# Patient Record
Sex: Female | Born: 1990 | Race: Black or African American | Hispanic: No | Marital: Single | Smoking: Never smoker
Health system: Southern US, Community
[De-identification: ages and names within clinical notes are randomized; demographics above are authoritative.]

---

## 2013-07-27 NOTE — Progress Notes (Signed)
Chief Complaint   Patient presents with   ??? New Patient   ??? Loss of Consciousness     fainting spells since 8th grade, recently 06/2013 patient fainted while getting something to eat at [facility]     Patient here to establish as a new patient  Patient c/o since 8th grade she has had fainting spells, recently in month of July 2014 while at [facility] getting something to eat the patient fainted and sat immediately down and was taken to a couch to lie down and she taken by Ambulance to the Emergency Room at the [facility] for further evaluation. Patient was diagnosed with syncope, and the patient was given fluids, labs were taken and her glucose was low and was given something to eat and she felt better. Orthostatic Blood Pressure was taken, EKG and was told the EKG was normal.

## 2013-07-27 NOTE — Progress Notes (Signed)
HISTORY OF PRESENT ILLNESS  Tracy [name] is a 22 y.o. female.  Chief Complaint   Patient presents with   ??? New Patient   ??? Loss of Consciousness     fainting spells since 8th grade, recently 06/2013 patient fainted while getting something to eat at [facility]       HPI  Patient here to establish as a new patient.     Patient reports that since 8th grade she has had fainting spells.  The most recent occurred last month.  Pt was at a summer program at [employer]; she was in the cafeteria getting ready to eat when she had to put her head down.  She felt like things were fading to black.  She did not have any chest pain or palpitations.  It had been approximately 12 hours since her last meal.  She thinks she did pass out.  Friends insisted that she go to the Emergency Room at the [facility] for further evaluation.  Patient was diagnosed with syncope.  She was given IV fluids and labs were drawn.  She was told that her glucose was low.  She felt better after eating.   She did have an EKG and was told the EKG was normal.     Pt does not think that these episodes have been preceded by chest pain or palpitations in the past.  She is usually aware that she is about to pass out.  She does not have any convulsions.  She does not have a post-ictal state.  She does not feel that she goes too long without eating but does confess to having skipped meals at times.    Review of Systems   Constitutional: Negative.    HENT: Negative.    Respiratory: Negative.    Cardiovascular: Negative.  Negative for chest pain and palpitations.   Neurological: Positive for loss of consciousness.   All other systems reviewed and are negative.        Physical Exam  Physical Exam   Nursing note and vitals reviewed.  Constitutional: She is oriented to person, place, and time. She appears well-developed and well-nourished.   HENT:   Head: Normocephalic and atraumatic.   Right Ear: External ear normal.   Left Ear: External ear normal.    Nose: Nose normal.   Eyes: Conjunctivae and EOM are normal.   Neck: Normal range of motion. Neck supple. No JVD present. Carotid bruit is not present. No thyromegaly present.   Cardiovascular: Normal rate, regular rhythm, normal heart sounds and intact distal pulses.  Exam reveals no gallop and no friction rub.    No murmur heard.  Pulmonary/Chest: Effort normal and breath sounds normal. She has no wheezes. She has no rhonchi. She has no rales.   Abdominal: Soft. Bowel sounds are normal.   Musculoskeletal: Normal range of motion.   Neurological: She is alert and oriented to person, place, and time. Coordination normal.   Skin: Skin is warm and dry.   Psychiatric: She has a normal mood and affect. Her behavior is normal. Judgment and thought content normal.     Ekg: nsr  ASSESSMENT and PLAN  Tracy [name] was seen today for new patient and loss of consciousness.    Diagnoses and associated orders for this visit:    Syncopal episodes  - REFERRAL TO CARDIOLOGY to r/o underlying cardiac disorder  - CBC WITH AUTOMATED DIFF  - METABOLIC PANEL, COMPREHENSIVE  - TSH, 3RD GENERATION  -  GLUCOSE TOLERANCE (4 SP BLOOD)  - AMB POC EKG ROUTINE W/ 12 LEADS, INTER & REP      Over 30 min spent with pt; greater than 50% of this time spent in counseling.  Discussed treatment plan at length; all questions answered.      Follow-up Disposition: as indicated; discussed possible referral to endo if cards eval negative

## 2013-07-27 NOTE — Patient Instructions (Signed)
Please contact our office if you have any questions about your visit today.

## 2013-07-27 NOTE — Telephone Encounter (Signed)
Chief Complaint   Patient presents with   ??? Referral Request     Cardiology Dr Victorio Palm Chough appt.07/27/13 at 11:20 am check in 11;00 am     07/27/13 at 3:30 pm: per Dr. Luiz Blare patient reached and informed her Cardiology appointment with Dr. Garwin Brothers at the [facility] facility [phone] /fax [phone] is scheduled for 07/30/13 at 11:20 am check in 11:00 am, and states she understands all.

## 2013-07-30 NOTE — Progress Notes (Signed)
HISTORY OF PRESENT ILLNESS  Tracy [name] is a 22 y.o. female.    HPI  She is referred to my office for evaluation of recurrent presyncope and syncope. She has had a long standing history of dizziness and syncope since 8th grade.  She is in college and she has had recurrence of severe dizziness and fainting spell.  Last episode was several weeks ago while standing in line to get her lunch.  She felt extremely hot and clammy and subsequently she sat down in a chair and her dizziness gradually dissipated. She was nauseated.  She indicates that frequently she gets nauseated and clammy before she passes out.  She has never had any chest pain or palpitations prior to dizziness or fainting spell.  She does exercise but she has never had any difficulties with dizziness or fainting spell with exercise.  One time, she was told that her blood sugar was low.  She is a non smoker and non drinker.  She has no history of hypertension or diabetes mellitus.  There is no family history of sudden death.  She has no history of congenital heart disease during childhood.        Review of Systems   Constitutional: Negative for weight loss and malaise/fatigue.   HENT: Negative for hearing loss.    Eyes: Negative for blurred vision and double vision.   Respiratory: Negative for shortness of breath.    Cardiovascular: Negative for chest pain, palpitations, orthopnea, claudication, leg swelling and PND.   Gastrointestinal: Negative for heartburn and blood in stool.   Genitourinary: Negative for hematuria.   Musculoskeletal: Negative for back pain and joint pain.   Skin: Negative for itching and rash.   Neurological: Positive for dizziness and loss of consciousness. Negative for weakness and headaches.       Physical Exam   Constitutional: She is oriented to person, place, and time. She appears well-developed and well-nourished.   HENT:   Head: Normocephalic.   Eyes: Conjunctivae are normal. Pupils are equal, round, and reactive to light.    Neck: Normal range of motion. Neck supple. No JVD present.   Cardiovascular: Normal rate, regular rhythm, S1 normal and S2 normal.   No extrasystoles are present. PMI is not displaced.  Exam reveals no gallop and no friction rub.    No murmur heard.  Pulses:       Carotid pulses are 3+ on the right side, and 3+ on the left side.  Pulmonary/Chest: Effort normal. She has no rales.   Abdominal: Soft. There is no tenderness.   Musculoskeletal: She exhibits no edema.   Neurological: She is alert and oriented to person, place, and time.   Skin: Skin is warm and dry.     BP 92/68   Pulse 63   Ht 5\' 2"  (1.575 m)   Wt 53.978 kg (119 lb)   BMI 21.76 kg/m2   SpO2 97%   LMP 07/12/2013    Past Medical History   Diagnosis Date   ??? Low blood sugar 06/2013     episode at [facility] while trying to get something to eat fainted   ??? Headache 07/26/13     severe headaches at times   ??? Heart murmur of newborn 09-18-1991     at birth       History     Social History   ??? Marital Status: SINGLE     Spouse Name: N/A     Number of Children: N/A   ???  Years of Education: N/A     Occupational History   ??? Not on file.     Social History Main Topics   ??? Smoking status: Never Smoker    ??? Smokeless tobacco: Never Used   ??? Alcohol Use: No   ??? Drug Use: No   ??? Sexually Active: Not on file     Other Topics Concern   ??? Not on file     Social History Narrative   ??? No narrative on file       Family History   Problem Relation Age of Onset   ??? Diabetes Father    ??? Hypertension Father    ??? Elevated Lipids Father    ??? Stroke Maternal Grandmother    ??? Alzheimer Maternal Grandfather    ??? Stroke Paternal Grandmother    ??? Heart Disease Paternal Grandfather    ??? Heart Attack Paternal Grandfather      Massive       Past Surgical History   Procedure Laterality Date   ??? Hx wisdom teeth extraction       x4           EKG: normal EKG, normal sinus rhythm.    ASSESSMENT and PLAN  Encounter Diagnoses   Name Primary?   ??? Syncope Yes   ??? 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  It has been troublesome since it has been going on for many years with increasing frequency.  I would proceed with echocardiogram to rule out any possibility of structural heart disease which is not apparent on physical examination.  She has no QT prolongation on EKG or any conduction abnormalities. She has no physical findings of obstructive cardiomyopathy or valvular heart disease which will be further evaluated by echocardiogram.  I would proceed with the head up tilt table test to rule out any possibility of vasodepressive syncope.  We may ultimately have to consider implantable loop recorder for further evaluation of any potential bradycardia or tachyarrhythmia in the future.

## 2013-08-02 NOTE — H&P (Signed)
I have met and evaluated patient. I reviewed the H&P below and there are no interval changes. Patient's questions regarding the procedure were answered.    Zerita Boers, MD  [phone]      HISTORY OF PRESENT ILLNESS   Tracy [name] is a 22 y.o. female.   HPI   She is referred to my office for evaluation of recurrent presyncope and syncope. She has had a long standing history of dizziness and syncope since 8th grade. She is in college and she has had recurrence of severe dizziness and fainting spell. Last episode was several weeks ago while standing in line to get her lunch. She felt extremely hot and clammy and subsequently she sat down in a chair and her dizziness gradually dissipated. She was nauseated. She indicates that frequently she gets nauseated and clammy before she passes out. She has never had any chest pain or palpitations prior to dizziness or fainting spell. She does exercise but she has never had any difficulties with dizziness or fainting spell with exercise. One time, she was told that her blood sugar was low. She is a non smoker and non drinker. She has no history of hypertension or diabetes mellitus. There is no family history of sudden death. She has no history of congenital heart disease during childhood.   Review of Systems   Constitutional: Negative for weight loss and malaise/fatigue.   HENT: Negative for hearing loss.   Eyes: Negative for blurred vision and double vision.   Respiratory: Negative for shortness of breath.   Cardiovascular: Negative for chest pain, palpitations, orthopnea, claudication, leg swelling and PND.   Gastrointestinal: Negative for heartburn and blood in stool.   Genitourinary: Negative for hematuria.   Musculoskeletal: Negative for back pain and joint pain.   Skin: Negative for itching and rash.   Neurological: Positive for dizziness and loss of consciousness. Negative for weakness and headaches.   Physical Exam   Constitutional: She is oriented to person, place,  and time. She appears well-developed and well-nourished.   HENT:   Head: Normocephalic.   Eyes: Conjunctivae are normal. Pupils are equal, round, and reactive to light.   Neck: Normal range of motion. Neck supple. No JVD present.   Cardiovascular: Normal rate, regular rhythm, S1 normal and S2 normal. No extrasystoles are present. PMI is not displaced. Exam reveals no gallop and no friction rub.   No murmur heard.   Pulses:   Carotid pulses are 3+ on the right side, and 3+ on the left side.   Pulmonary/Chest: Effort normal. She has no rales.   Abdominal: Soft. There is no tenderness.   Musculoskeletal: She exhibits no edema.   Neurological: She is alert and oriented to person, place, and time.   Skin: Skin is warm and dry.   BP 92/68   Pulse 63   Ht 5\' 2"  (1.575 m)   Wt 53.978 kg (119 lb)   BMI 21.76 kg/m2   SpO2 97%   LMP 07/12/2013   Past Medical History    Diagnosis  Date    ???  Low blood sugar  06/2013      episode at [facility] while trying to get something to eat fainted    ???  Headache  07/26/13      severe headaches at times    ???  Heart murmur of newborn  06-Aug-1991      at birth      History      Social History    ???  Marital Status:  SINGLE      Spouse Name:  N/A      Number of Children:  N/A    ???  Years of Education:  N/A      Occupational History    ???  Not on file.      Social History Main Topics    ???  Smoking status:  Never Smoker    ???  Smokeless tobacco:  Never Used    ???  Alcohol Use:  No    ???  Drug Use:  No    ???  Sexually Active:  Not on file      Other Topics  Concern    ???  Not on file      Social History Narrative    ???  No narrative on file      Family History    Problem  Relation  Age of Onset    ???  Diabetes  Father     ???  Hypertension  Father     ???  Elevated Lipids  Father     ???  Stroke  Maternal Grandmother     ???  Alzheimer  Maternal Grandfather     ???  Stroke  Paternal Grandmother     ???  Heart Disease  Paternal Grandfather     ???  Heart Attack  Paternal Grandfather       Massive      Past Surgical History     Procedure  Laterality  Date    ???  Hx wisdom teeth extraction        x4    EKG: normal EKG, normal sinus rhythm.   ASSESSMENT and PLAN   Encounter Diagnoses    Name  Primary?    ???  Syncope  Yes    ???  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 It has been troublesome since it has been going on for many years with increasing frequency. I would proceed with echocardiogram to rule out any possibility of structural heart disease which is not apparent on physical examination. She has no QT prolongation on EKG or any conduction abnormalities. She has no physical findings of obstructive cardiomyopathy or valvular heart disease which will be further evaluated by echocardiogram. I would proceed with the head up tilt table test to rule out any possibility of vasodepressive syncope. We may ultimately have to consider implantable loop recorder for further evaluation of any potential bradycardia or tachyarrhythmia in the future.

## 2013-08-02 NOTE — Progress Notes (Signed)
Dr. Fransico Michael in to speak w/ pt and mother. Pt returned to baseline.  PIV d/c'd, pt denies pain.  D/c'd home to mother.

## 2013-08-02 NOTE — Progress Notes (Signed)
Pt prepped and ready for tilt table study.  Dr. Fransico Michael notified.

## 2013-08-02 NOTE — Progress Notes (Signed)
Pt resting comfortably.  Tilted pt up to 70 degrees.  No complains at this time.

## 2013-08-02 NOTE — Procedures (Signed)
[facility]                               [address]                          [place], [place] [address]                                CARDIAC TILT TABLE    PATIENT:   Tracy [name], Tracy [name]  MRN:           [id number]      DATE:       08/02/2013  BILLING:       [id number]   LOCATION:   1CH [id number]  REFERRING: NIKKI L GRAVES, MD  DICTATING: Ivianna Notch, MD      REASON FOR REFERRAL: Syncope.    The patient was brought in the fasting state to the cardiac catheterization  holding area. There she received 500 mL of intravenous normal saline. In the  supine position, her blood pressure was low 100s over 60s. Heart rate was  60 when awake and when falling asleep it was in the mid 50s. Then, she  assumed [name] 70-degree heads-up tilt table position with her blood pressure  varying between 90s/60 and 105/65 and her heart rate remained in the 60s.  She fell asleep. Upon awakening, for receiving nitroglycerin sublingually her heart rate went into the 90s. She remained asymptomatic. After 30  minutes of monitoring, she received 0.4 mg sublingual nitroglycerin and was  monitored for an additional 10 minutes. Her heart rate remained in the high  90s, and the blood pressure dropped [name] little in the mid 90s/50s. She  remained asymptomatic throughout. At the end of the procedure, the patient  was discharged home in stable condition.    IMPRESSION: Normal tilt table test at baseline and after pharmacologic  stimulation with no reproduction of symptoms.                     Karson Chicas, MD    CM:wmx  D:08/02/2013  03:26 P  CScript:08/02/2013 04:45 P  CQDocID #: [id number]  CScriptDoc #: [id number]  cc:   DAE B. CHOUGH, MD        NIKKI L GRAVES, MD        Auriel Kist, MD

## 2013-08-02 NOTE — Progress Notes (Signed)
NTG 0.4mg  spray given underneath pt tongue per Tilt protocol.  Pt previously resting soundly, awoke pt to give NTG, HR noticeably increased at this time from 60's to 97-113.

## 2013-08-02 NOTE — Progress Notes (Signed)
Tilt table started with initiation of 500cc NS bolus over 30 minutes.  Pt lying supine, in NAD, denies pain at this time.

## 2013-08-02 NOTE — Procedures (Signed)
[facility]                               [address]                          [place], [place] [id number]                                CARDIAC TILT TABLE    PATIENT:   Tracy GrieveCORSE, Niasha A  MRN:           [id number]      DATE:       08/02/2013  BILLING:       [id number]   LOCATION:   1CH [id number]  REFERRING: Modesto CharonNIKKI L GRAVES, MD  DICTATING: Zerita BoersALIN Katalaya Beel, MD      REASON FOR REFERRAL: Syncope.    The patient was brought in the fasting state to the cardiac catheterization  holding area. There she received 500 mL of intravenous normal saline. In the  supine position, her blood pressure was low 100s over 60s. Heart rate was  60 when awake and when falling asleep it was in the mid 50s. Then, she  assumed a 70-degree heads-up tilt table position with her blood pressure  varying between 90s/60 and 105/65 and her heart rate remained in the 60s.  She fell asleep. Upon awakening, for receiving nitroglycerin sublingually her heart rate went into the 90s. She remained asymptomatic. After 30  minutes of monitoring, she received 0.4 mg sublingual nitroglycerin and was  monitored for an additional 10 minutes. Her heart rate remained in the high  90s, and the blood pressure dropped a little in the mid 90s/50s. She  remained asymptomatic throughout. At the end of the procedure, the patient  was discharged home in stable condition.    IMPRESSION: Normal tilt table test at baseline and after pharmacologic  stimulation with no reproduction of symptoms.                     Zerita BoersALIN Chealsey Miyamoto, MD    CM:wmx  D:08/02/2013  03:26 P  CScript:08/02/2013 04:45 P  CQDocID #: [id number]  CScriptDoc #: [id number]  cc:   DAE B. CHOUGH, MD        Modesto CharonNIKKI L GRAVES, MD        Zerita BoersALIN Candiace West, MD

## 2013-08-06 NOTE — Progress Notes (Signed)
Quick Note:    Ordered to r/o vaso syncope.  ______

## 2013-08-23 NOTE — Telephone Encounter (Signed)
Message copied by Baltazar Najjar on Thu Aug 23, 2013  3:12 PM  ------       Message from: [place], [place] B       Created: Tue Aug 07, 2013  6:19 PM         Called her       ----- Message -----          From: Baltazar Najjar, RN          Sent: 08/06/2013   9:17 AM            To: Edrick Oh, MD              Ordered to r/o vaso syncope.         ------

## 2013-09-17 LAB — AMB POC URINALYSIS DIP STICK AUTO W/O MICRO
Bilirubin (UA POC): NEGATIVE
Blood (UA POC): NEGATIVE
Glucose (UA POC): NEGATIVE
Ketones (UA POC): NEGATIVE
Nitrites (UA POC): NEGATIVE
Protein (UA POC): NEGATIVE mg/dL
Specific gravity (UA POC): 1.02 (ref 1.001–1.035)
Urobilinogen (UA POC): 0.2 (ref 0.2–1)
pH (UA POC): 6 (ref 4.6–8.0)

## 2013-09-17 NOTE — Progress Notes (Signed)
Chief Complaint   Patient presents with   ??? Vaginal Discharge     x 2 weeks with cottage cheese vaginal discharge, no odor   ??? Vaginal Itching     x 2 weeks   ??? Urinary Pain     x 2 weeks     Patient c/o past 2 weeks with vaginal discharge and has cottage cheese consistency, itching.  Patient denies any odor  Patient c/o past 2 weeks with urinary pain with urination

## 2013-09-17 NOTE — Patient Instructions (Signed)
Please contact our office if you have any questions about your visit today.

## 2013-09-17 NOTE — Progress Notes (Signed)
HISTORY OF PRESENT ILLNESS  Tracy [name] is a 22 y.o. female.  Chief Complaint   Patient presents with   ??? Vaginal Discharge     x 2 weeks with cottage cheese vaginal discharge, no odor   ??? Vaginal Itching     x 2 weeks   ??? Urinary Pain     x 2 weeks       HPI  Patient c/o past 2 weeks with vaginal discharge.  She reports that it has a cottage cheese consistency.  She does have itching.  Patient denies any odor.      Patient c/o past 2 weeks with urinary pain with urination.  She admits that she often holds her urine for prolonged periods of time if she is in class and does not want to miss any of the lecture.  She feels that she can work on this.      Review of Systems   Constitutional: Negative.  Negative for fever and chills.   HENT: Negative.    Respiratory: Negative.    Cardiovascular: Negative.    Gastrointestinal: Negative.  Negative for nausea and vomiting.   Genitourinary: Positive for dysuria.        Vag d/c   All other systems reviewed and are negative.        Physical Exam  Physical Exam   Nursing note and vitals reviewed.  Constitutional: She is oriented to person, place, and time. She appears well-developed and well-nourished.   HENT:   Head: Normocephalic and atraumatic.   Right Ear: External ear normal.   Left Ear: External ear normal.   Nose: Nose normal.   Eyes: Conjunctivae and EOM are normal.   Neck: Normal range of motion. Neck supple. No JVD present. Carotid bruit is not present. No thyromegaly present.   Cardiovascular: Normal rate, regular rhythm, normal heart sounds and intact distal pulses.  Exam reveals no gallop and no friction rub.    No murmur heard.  Pulmonary/Chest: Effort normal and breath sounds normal. She has no wheezes. She has no rhonchi. She has no rales.   Abdominal: Soft. Bowel sounds are normal. mild suprapubic ttp.  No cva ttp.    Musculoskeletal: Normal range of motion.   Neurological: She is alert and oriented to person, place, and time. Coordination normal.   Skin:  Skin is warm and dry.   Psychiatric: She has a normal mood and affect. Her behavior is normal. Judgment and thought content normal.     ASSESSMENT and PLAN  Tracy [name] was seen today for vaginal discharge, vaginal itching and urinary pain.    Diagnoses and associated orders for this visit:    Urinary tract infection, site not specified  - trimethoprim-sulfamethoxazole (BACTRIM DS, SEPTRA DS) 160-800 mg per tablet; Take 1 tablet by mouth two (2) times a day for 3 days.    Vaginal candidiasis  - fluconazole (DIFLUCAN) 150 mg tablet; Take 1 tablet by mouth daily for 1 day.    Vaginal itching  - AMB POC URINALYSIS DIP STICK OR TABLET REAGENT AUTO W/O MICRO  - CULTURE, URINE    Vaginal discharge  - AMB POC URINALYSIS DIP STICK OR TABLET REAGENT AUTO W/O MICRO  - CULTURE, URINE    Pain with urination  - AMB POC URINALYSIS DIP STICK OR TABLET REAGENT AUTO W/O MICRO  - CULTURE, URINE        Follow-up Disposition: prn

## 2013-09-19 LAB — CULTURE, URINE
Urine Culture, Routine: NO GROWTH
Urine Culture, Routine: NO GROWTH

## 2015-08-01 ENCOUNTER — Ambulatory Visit
Admit: 2015-08-01 | Discharge: 2015-08-01 | Payer: PRIVATE HEALTH INSURANCE | Attending: Family Medicine | Primary: Family Medicine

## 2015-08-01 DIAGNOSIS — Z0000 Encounter for general adult medical examination without abnormal findings: Secondary | ICD-10-CM

## 2015-08-01 LAB — AMB POC TUBERCULOSIS, INTRADERMAL (SKIN TEST)
PPD: NEGATIVE Negative
mm Induration: 0 mm

## 2015-08-01 NOTE — Patient Instructions (Signed)
Vaccine Information Statement     Tdap (Tetanus, Diphtheria, Pertussis) Vaccine: What You Need to Know    Many Vaccine Information Statements are available in Spanish and other languages. See www.immunize.org/vis.  Hojas de Informaci??n Sobre Vacunas est??n disponibles en espa??ol y en muchos otros idiomas. Visite http://www.immunize.org/vis    1. Why get vaccinated?    Tetanus, diphtheria, and pertussis are very serious diseases. Tdap vaccine can protect us from these diseases.  And, Tdap vaccine given to pregnant women can protect newborn babies against pertussis.    TETANUS (Lockjaw) is rare in the [place] today. It causes painful muscle tightening and stiffness, usually all over the body.  ? It can lead to tightening of muscles in the head and neck so you can???t open your mouth, swallow, or sometimes even breathe. Tetanus kills about 1 out of 10 people who are infected even after receiving the best medical care.      DIPHTHERIA is also rare in the [place] today.  It can cause a thick coating to form in the back of the throat.  ? It can lead to breathing problems, heart failure, paralysis, and death.    PERTUSSIS (Whooping Cough) causes severe coughing spells, which can cause difficulty breathing, vomiting, and disturbed sleep.  ? It can also lead to weight loss, incontinence, and rib fractures. Up to 2 in 100 adolescents and 5 in 100 adults with pertussis are hospitalized or have complications, which could include pneumonia or death.     These diseases are caused by bacteria. Diphtheria and pertussis are spread from person to person through secretions from coughing or sneezing. Tetanus enters the body through cuts, scratches, or wounds.    Before vaccines, as many as 200,000 cases of diphtheria, 200,000 cases of pertussis, and hundreds of cases of tetanus, were reported in the [place] each year. Since vaccination began, reports of cases for tetanus  and diphtheria have dropped by about 99% and for pertussis by about 80%.    2. Tdap vaccine    Tdap vaccine can protect adolescents and adults from tetanus, diphtheria, and pertussis. One dose of Tdap is routinely given at age 11 or 12.  People who did not get Tdap at that age should get it as soon as possible.    Tdap is especially important for health care professionals and anyone having close contact with a baby younger than 12 months.      Pregnant women should get a dose of Tdap during every pregnancy, to protect the newborn from pertussis.  Infants are most at risk for severe, life-threatening complications from pertussis.    Another vaccine, called Td, protects against tetanus and diphtheria, but not pertussis. A Td booster should be given every 10 years. Tdap may be given as one of these boosters if you have never gotten Tdap before.  Tdap may also be given after a severe cut or burn to prevent tetanus infection.    Your doctor or the person giving you the vaccine can give you more information.    Tdap may safely be given at the same time as other vaccines.    3. Some people should not get this vaccine    ??? A person who has ever had a life-threatening allergic reaction after a previous dose of any diphtheria, tetanus or pertussis containing vaccine, OR has a severe allergy to any part of this vaccine, should not get Tdap vaccine.  Tell the person giving the vaccine about any severe allergies.    ???   Anyone who had coma or long repeated seizures within 7 days after a childhood dose of DTP or DTaP, or a previous dose of Tdap, should not get Tdap, unless a cause other than the vaccine was found.  They can still get Td.    ??? Talk to your doctor if you:  - have seizures or another nervous system problem,  - had severe pain or swelling after any vaccine containing diphtheria, tetanus or pertussis,   - ever had a condition called Guillain Barr?? Syndrome (GBS),  - aren???t feeling well on the day the shot is scheduled.     4. Risks    With any medicine, including vaccines, there is a chance of side effects. These are usually mild and go away on their own. Serious reactions are also possible but are rare.     Most people who get Tdap vaccine do not have any problems with it.    Mild Problems following Tdap  (Did not interfere with activities)  ??? Pain where the shot was given (about 3 in 4 adolescents or 2 in 3 adults)  ??? Redness or swelling where the shot was given (about 1 person in 5)  ??? Mild fever of at least 100.4??F (up to about 1 in 25 adolescents or 1 in 100 adults)  ??? Headache (about 3 or 4 people in 10)  ??? Tiredness (about 1 person in 3 or 4)  ??? Nausea, vomiting, diarrhea, stomach ache (up to 1 in 4 adolescents or 1 in 10 adults)  ??? Chills,  sore joints (about 1 person in 10)  ??? Body aches (about 1 person in 3 or 4)   ??? Rash, swollen glands (uncommon)    Moderate Problems following Tdap  (Interfered with activities, but did not require medical attention)  ??? Pain where the shot was given (up to 1 in 5 or 6)   ??? Redness or swelling where the shot was given (up to about 1 in 16 adolescents or 1 in 12 adults)  ??? Fever over 102??F (about 1 in 100 adolescents or 1 in 250 adults)  ??? Headache (about 1 in 7 adolescents or 1 in 10 adults)  ??? Nausea, vomiting, diarrhea, stomach ache (up to 1 or 3 people in 100)  ??? Swelling of the entire arm where the shot was given (up to about 1 in 500).     Severe Problems following Tdap  (Unable to perform usual activities; required medical attention)  ??? Swelling, severe pain, bleeding, and redness in the arm where the shot was given (rare).    Problems that could happen after any vaccine:    ??? People sometimes faint after a medical procedure, including vaccination. Sitting or lying down for about 15 minutes can help prevent fainting, and injuries caused by a fall. Tell your doctor if you feel dizzy, or have vision changes or ringing in the ears.     ??? Some people get severe pain in the shoulder and have difficulty moving the arm where a shot was given. This happens very rarely.    ??? Any medication can cause a severe allergic reaction. Such reactions from a vaccine are very rare, estimated at fewer than 1 in a million doses, and would happen within a few minutes to a few hours after the vaccination.     As with any medicine, there is a very remote chance of a vaccine causing a serious injury or death.    The safety of vaccines is   always being monitored. For more information, visit: www.cdc.gov/vaccinesafety/    5. What if there is a serious problem?    What should I look for?  ??? Look for anything that concerns you, such as signs of a severe allergic reaction, very high fever, or unusual behavior.    ??? Signs of a severe allergic reaction can include hives, swelling of the face and throat, difficulty breathing, a fast heartbeat, dizziness, and weakness. These would usually start a few minutes to a few hours after the vaccination.    What should I do?  ??? If you think it is a severe allergic reaction or other emergency that can???t wait, call 9-1-1 or get the person to the nearest hospital. Otherwise, call your doctor.    ??? Afterward, the reaction should be reported to the Vaccine Adverse Event Reporting System (VAERS). Your doctor might file this report, or you can do it yourself through the VAERS web site at www.vaers.hhs.gov, or by calling 1-800-822-7967.    VAERS does not give medical advice.    6. The National Vaccine Injury Compensation Program    The National Vaccine Injury Compensation Program (VICP) is a federal program that was created to compensate people who may have been injured by certain vaccines.    Persons who believe they may have been injured by a vaccine can learn about the program and about filing a claim by calling 1-800-338-2382 or visiting the VICP website at www.hrsa.gov/vaccinecompensation. There is a  time limit to file a claim for compensation.     7. How can I learn more?    ??? Ask your doctor. He or she can give you the vaccine package insert or suggest other sources of information.  ??? Call your local or state health department.  ??? Contact the Centers for Disease Control and Prevention (CDC):  - Call 1-800-232-4636 (1-800-CDC-INFO) or  - Visit CDC???s website at www.cdc.gov/vaccines      Vaccine Information Statement   Tdap Vaccine  (02/19/2014)  42 U.S.C. ?? 300aa-26    Department of Health and Human Services  Centers for Disease Control and Prevention    Office Use Only

## 2015-08-01 NOTE — Progress Notes (Signed)
1. Have you been to the ER, urgent care clinic since your last visit?  Hospitalized since your last visit?No    2. Have you seen or consulted any other health care providers outside of the [facility] since your last visit?  Include any pap smears or colon screening. No    Is someone accompanying this pt? no    Is the patient using any DME equipment during OV? no      Chief Complaint   Patient presents with   ??? Complete Physical         Tracy [name] is a 24 y.o. female who presents for routine immunizations.   She denies any symptoms , reactions or allergies that would exclude them from being immunized today.  Risks and adverse reactions were discussed and the VIS was given to them. All questions were addressed.  She was observed for 5 min post injection. There were no reactions observed.    Jeralene PetersNilza Lexie Morini, LPN

## 2015-08-01 NOTE — Progress Notes (Signed)
Subjective:   24 y.o. female for annual medical exam.   Her gyne and breast care is done elsewhere by her Ob-Gyne physician.    Patient Active Problem List   Diagnosis Code   ??? Syncopal episodes R55   ??? Vaso vagal episode R55       No Known Allergies  Past Medical History   Diagnosis Date   ??? Low blood sugar 06/2013     [facility] fainted   ??? Headache 07/26/13     severe headaches at times   ??? Heart murmur of newborn 06-03-1991     at birth     Past Surgical History   Procedure Laterality Date   ??? Hx wisdom teeth extraction       x4     Family History   Problem Relation Age of Onset   ??? Diabetes Father    ??? Hypertension Father    ??? Elevated Lipids Father    ??? Stroke Maternal Grandmother    ??? Alzheimer Maternal Grandfather    ??? Stroke Paternal Grandmother    ??? Heart Disease Paternal Grandfather    ??? Heart Attack Paternal Grandfather      Massive     History   Substance Use Topics   ??? Smoking status: Never Smoker    ??? Smokeless tobacco: Never Used   ??? Alcohol Use: No          ROS: Feeling generally well. No TIA's or unusual headaches, no dysphagia.  No prolonged cough. No dyspnea or chest pain on exertion.  No abdominal pain, change in bowel habits, black or bloody stools.  No urinary tract symptoms.  No new or unusual musculoskeletal symptoms.    Specific concerns today: pt has finished college with her BS in biology from [employer].  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  She plans to pursue an NP degree later.  Pt needs to update her immunizations today.     Objective:   The patient appears well, alert, oriented x 3, in no distress.  BP 118/74 mmHg   Pulse 72   Temp(Src) 98.9 ??F (37.2 ??C) (Oral)   Resp 15   Ht 5\' 2"  (1.575 m)   Wt 121 lb (54.885 kg)   BMI 22.13 kg/m2   SpO2 99%   LMP 07/11/2015  ENT normal.  Neck supple. No adenopathy or thyromegaly. PERLA. Lungs are clear, good air entry, no wheezes, rhonchi or rales. S1 and S2 normal, no  murmurs, regular rate and rhythm. Abdomen soft without tenderness, guarding, mass or organomegaly. Extremities show no edema, normal peripheral pulses. Neurological is normal, no focal findings.  Breast and Pelvic exams are deferred.    Assessment/Plan:   Well Woman  call if any problems  Dondra PraderDominique was seen today for complete physical.    Diagnoses and all orders for this visit:    Well woman exam (no gynecological exam)  Comments:   [V70.0]    Encounter for immunization  Orders:  -     Tetanus, diphtheria toxoids and acellular pertussis (TDAP) vaccine, in individuals >=7 years, IM    Screening for tuberculosis  Orders:  -     AMB POC TUBERCULOSIS, INTRADERMAL (SKIN TEST)

## 2015-08-04 NOTE — Addendum Note (Signed)
Addended by: CREECH, KAYLA N on: 08/04/2015 07:58 AM      Modules accepted: Level of Service

## 2015-08-05 NOTE — Telephone Encounter (Signed)
Pt asking for a tb shot and a chicken pox shot,is an appt. needed

## 2015-08-06 NOTE — Telephone Encounter (Signed)
Pt informed that she must wait for 14 days for the second PPD placement, and was also given the nurse visit hours.

## 2015-08-29 ENCOUNTER — Institutional Professional Consult (permissible substitution): Admit: 2015-08-29 | Payer: PRIVATE HEALTH INSURANCE | Attending: Family Medicine | Primary: Family Medicine

## 2015-08-29 ENCOUNTER — Institutional Professional Consult (permissible substitution)
Admit: 2015-08-29 | Discharge: 2015-08-29 | Payer: PRIVATE HEALTH INSURANCE | Attending: Family Medicine | Primary: Family Medicine

## 2015-08-29 DIAGNOSIS — Z23 Encounter for immunization: Secondary | ICD-10-CM

## 2015-08-29 NOTE — Progress Notes (Signed)
Tracy [name] [birth year] . female who presents for routine immunizations.   She denies any symptoms , reactions or allergies that would exclude them from being immunized today.  Risks and adverse reactions were discussed and the VIS was given to them. All questions were addressed.  Order placed for varicella,  per Verbal Order from DR.Graves with read back.  Patient was observed for 15 min post injection. There were no reactions observed.    Raynelle Chary, LPN

## 2015-08-29 NOTE — Patient Instructions (Signed)
Chickenpox Vaccine: What You Need to Know    Many Vaccine Information Statements are available in Spanish and other languages. See AbsolutelyGenuine.com.br.    1. Why get vaccinated?    Chickenpox (also called varicella) is a common childhood disease. It is usually mild, but it can be serious, especially in young infants and adults.    ??? It causes a rash, itching, fever, and tiredness.  ??? It can lead to severe skin infection, scars, pneumonia, brain damage, or death.  ??? The chickenpox virus can be spread from person to person through the air, or by contact with fluid from chickenpox blisters.  ??? A person who has had chickenpox can get a painful rash called shingles years later.  ??? Before the vaccine, about 11,000 people were hospitalized for chickenpox each year in the [place].  ??? Before the vaccine, about 100 people died each year as a result of chickenpox in the [place].    Chickenpox vaccine can prevent chickenpox.    Most people who get chickenpox vaccine will not get chickenpox.  But if someone who has been vaccinated does get chickenpox, it is usually very mild.  They will have fewer blisters, are less likely to have a fever, and will recover faster.      2. Who should get chickenpox vaccine and when?    Routine  Children who have never had chickenpox should get 2doses of chickenpox vaccine at these ages:    1st Dose: 20-77 months of age  31nd Dose: 77-32 years of age (may be given earlier, if at least 3 months after the 1st dose)    People 90 years of age and older (who have never had chickenpox or received chickenpox vaccine) should get two doses at least 28 days apart.    Catch-Up  Anyone who is not fully vaccinated, and never had chickenpox, should receive one or two doses of chickenpox vaccine.  The timing of these doses depends on the person???s age.  Ask your provider.      Chickenpox vaccine may be given at the same time as other vaccines.     Note: A ???combination??? vaccine called MMRV, which contains both chickenpox and MMR and vaccines, may be given instead of the two individual vaccines to people 9 years of age and younger.  3. Some people should not get chickenpox vaccine or should wait.    ??? People should not get chickenpox vaccine if they have ever had a life-threatening allergic reaction to a previous dose of chickenpox vaccine or to gelatin or the antibiotic neomycin.    ??? People who are moderately or severely ill at the time the shot is scheduled should usually wait until they recover before getting chickenpox vaccine.    ??? Pregnant women should wait to get chickenpox vaccine until after they have given birth. Women should not get pregnant for 1 month after getting chickenpox vaccine.     ??? Some people should check with their doctor about whether they should get chickenpox vaccine, including anyone who:   -  Has HIV/AIDS or another disease that affects the immune system   -  Is being treated with drugs that affect the immune system, such as steroids, for 2 weeks or longer   - Has any kind of cancer   - Is getting cancer treatment with radiation or drugs    ??? People who recently had a transfusion or were given other blood products should ask their doctor when they may  get chickenpox vaccine.    Ask your provider for more information.      4. What are the risks from chickenpox vaccine?    A vaccine, like any medicine, is capable of causing serious problems, such as severe allergic reactions. The risk of chickenpox vaccine causing serious harm, or death, is extremely small.    Getting chickenpox vaccine is much safer than getting chickenpox disease.  Most people who get chickenpox vaccine do not have any problems with it. Reactions are usually more likely after the first dose than after the second.     Mild Problems  ??? Soreness or swelling where the shot was given (about 1 out of 5 children and up to 1 out of 3 adolescents and adults)   ??? Fever (1 person out of 10, or less)  ??? Mild rash, up to a month after vaccination (1 person out of 25). It is possible for these people to infect other members of their household, but this is extremely rare.    Moderate Problems  ??? Seizure (jerking or staring) caused by fever (very rare).    Severe Problems  ??? Pneumonia (very rare)  Other serious problems, including severe brain reactions and low blood count, have been reported after chickenpox vaccination. These happen so rarely experts cannot tell whether they are caused by the vaccine or not. If they are, it is extremely rare.    Note: The first dose of MMRV vaccine has been associated with rash and higher rates of fever than MMR and varicella vaccines given separately. Rash has been reported in about 1 person in 20 and fever in about 1 person in 5.   Seizures caused by a fever are also reported more often after MMRV. These usually occur 5-12 days after the first dose.       5. What if there is a moderate or severe reaction?    What should I look for?  Any unusual condition, such as a high fever, weakness,  or behavior changes. Signs of a severe allergic reaction can include difficulty breathing, hoarseness or wheezing, hives, paleness, weakness, a fast heart beat or dizziness.    What should I do?   ??? Call a doctor, or get the person to a doctor right away.   ??? Tell the doctor what happened, the date and time it happened, and when the vaccination was given.   ??? Ask your provider to report the reaction by filing a Vaccine Adverse Event Reporting System  VAERS) form. Or you can file this report through the VAERS website at www.vaers.SamedayNews.es, or by calling (862)410-0200.     VAERS does not provide medical advice.      6. The National Vaccine Injury Compensation Program    A federal program has been created to help people who may have been harmed by a vaccine.     For details about the National Vaccine Injury Compensation Program, call  8671539883 or visit their website at GoldCloset.com.ee.      7. How can I learn more?    ??? Ask your provider. They can give you the vaccine package insert or suggest other sources of    information.  ??? Call your local or state health department.  ??? Contact the Centers for Disease Control and Prevention (CDC):   - Call 502-615-1613 (1-800-CDC-INFO)   - Visit CDC???s website at http://hunter.com/    Vaccine Information Statement (Interim)  Varicella Vaccine  (03/09/07)   42 U.S.C. ?? 785YI-50  Department of Health and Gaffer for Disease Control and Prevention

## 2015-09-02 ENCOUNTER — Institutional Professional Consult (permissible substitution)
Admit: 2015-09-02 | Discharge: 2015-09-02 | Payer: PRIVATE HEALTH INSURANCE | Attending: Family Medicine | Primary: Family Medicine

## 2015-09-02 DIAGNOSIS — Z111 Encounter for screening for respiratory tuberculosis: Secondary | ICD-10-CM

## 2015-09-02 LAB — AMB POC TUBERCULOSIS, INTRADERMAL (SKIN TEST)
PPD: NEGATIVE Negative
mm Induration: 0 mm

## 2015-09-02 NOTE — Progress Notes (Signed)
Chief Complaint   Patient presents with   ??? Immunization/Injection     PPD placement     PPD Placement note  Tracy [name], 24 y.o. female is here today for placement of PPD test  Reason for PPD test: school  Pt taken PPD test before: yes  Verified in allergy area and with patient that they are not allergic to the products PPD is made of (Phenol or Tween). Yes  Is patient taking any oral or IV steroid medication now or have they taken it in the last month? no  Has the patient ever received the BCG vaccine?: no  Has the patient been in recent contact with anyone known or suspected of having active TB disease?: no              O: Alert and oriented in NAD.  P:  PPD placed in right forearm on 09/02/2015 at 10:25 am.  Patient advised to return for reading within 48-72 hours.  Order for PPD placed per verbal order with read back from Dr. Luiz Blare.

## 2015-09-02 NOTE — Patient Instructions (Signed)
Please contact our office if you have any questions about your visit today.

## 2015-09-02 NOTE — Progress Notes (Signed)
error 

## 2015-10-02 ENCOUNTER — Institutional Professional Consult (permissible substitution)
Admit: 2015-10-02 | Discharge: 2015-10-02 | Payer: PRIVATE HEALTH INSURANCE | Attending: Internal Medicine | Primary: Family Medicine

## 2015-10-02 DIAGNOSIS — Z23 Encounter for immunization: Secondary | ICD-10-CM

## 2015-10-02 NOTE — Progress Notes (Signed)
Tracy [name] June 30, 1991 . female who presents for routine immunizations.   She denies any symptoms , reactions or allergies that would exclude them from being immunized today.  Risks and adverse reactions were discussed and the VIS was given to them. All questions were addressed.  Order placed for Flu vaccine,  per Verbal Order from Dr. Doree Barthel with read back.  Patient was observed for 15 min post injection. There were no reactions observed.    Jeralene Peters [name], LPN

## 2015-10-02 NOTE — Patient Instructions (Signed)
Vaccine Information Statement    Influenza (Flu) Vaccine (Inactivated or Recombinant): What you need to know    Many Vaccine Information Statements are available in Spanish and other languages. See www.immunize.org/vis  Hojas de Informaci??n Sobre Vacunas est??n disponibles en Espa??ol y en muchos otros idiomas. Visite www.immunize.org/vis    1. Why get vaccinated?    Influenza (???flu???) is a contagious disease that spreads around the [place] every year, usually between October and May.     Flu is caused by influenza viruses, and is spread mainly by coughing, sneezing, and close contact.     Anyone can get flu. Flu strikes suddenly and can last several days. Symptoms vary by age, but can include:  ??? fever/chills  ??? sore throat  ??? muscle aches  ??? fatigue  ??? cough  ??? headache   ??? runny or stuffy nose    Flu can also lead to pneumonia and blood infections, and cause diarrhea and seizures in children.  If you have a medical condition, such as heart or lung disease, flu can make it worse.    Flu is more dangerous for some people. Infants and young children, people 65 years of age and older, pregnant women, and people with certain health conditions or a weakened immune system are at greatest risk.      Each year thousands of people in the [place] die from flu, and many more are hospitalized.     Flu vaccine can:  ??? keep you from getting flu,  ??? make flu less severe if you do get it, and  ??? keep you from spreading flu to your family and other people.     2. Inactivated and recombinant flu vaccines    A dose of flu vaccine is recommended every flu season. Children 6 months through 8 years of age may need two doses during the same flu season.  Everyone else needs only one dose each flu season.       Some inactivated flu vaccines contain a very small amount of a mercury-based preservative called thimerosal. Studies have not shown thimerosal in vaccines to be harmful, but flu vaccines that do not contain  thimerosal are available.    There is no live flu virus in flu shots.  They cannot cause the flu.     There are many flu viruses, and they are always changing. Each year a new flu vaccine is made to protect against three or four viruses that are likely to cause disease in the upcoming flu season. But even when the vaccine doesn???t exactly match these viruses, it may still provide some protection    Flu vaccine cannot prevent:  ??? flu that is caused by a virus not covered by the vaccine, or  ??? illnesses that look like flu but are not.    It takes about 2 weeks for protection to develop after vaccination, and protection lasts through the flu season.     3. Some people should not get this vaccine    Tell the person who is giving you the vaccine:    ??? If you have any severe, life-threatening allergies.    If you ever had a life-threatening allergic reaction after a dose of flu vaccine, or have a severe allergy to any part of this vaccine, you may be advised not to get vaccinated.  Most, but not all, types of flu vaccine contain a small amount of egg protein.       ??? If you   ever had Guillain-Barr?? Syndrome (also called GBS).   Some people with a history of GBS should not get this vaccine. This should be discussed with your doctor.    ??? If you are not feeling well.    It is usually okay to get flu vaccine when you have a mild illness, but you might be asked to come back when you feel better.      4. Risks of a vaccine reaction    With any medicine, including vaccines, there is a chance of reactions. These are usually mild and go away on their own, but serious reactions are also possible.     Most people who get a flu shot do not have any problems with it.     Minor problems following a flu shot include:   ??? soreness, redness, or swelling where the shot was given    ??? hoarseness  ??? sore, red or itchy eyes  ??? cough  ??? fever  ??? aches  ??? headache  ??? itching  ??? fatigue   If these problems occur, they usually begin soon after the shot and last 1 or 2 days.     More serious problems following a flu shot can include the following:    ??? There may be a small increased risk of Guillain-Barr?? Syndrome (GBS) after inactivated flu vaccine.  This risk has been estimated at 1 or 2 additional cases per million people vaccinated. This is much lower than the risk of severe complications from flu, which can be prevented by flu vaccine.      ??? Young children who get the flu shot along with pneumococcal vaccine (PCV13) and/or DTaP vaccine at the same time might be slightly more likely to have a seizure caused by fever. Ask your doctor for more information. Tell your doctor if a child who is getting flu vaccine has ever had a seizure.     Problems that could happen after any injected vaccine:     ??? People sometimes faint after a medical procedure, including vaccination. Sitting or lying down for about 15 minutes can help prevent fainting, and injuries caused by a fall. Tell your doctor if you feel dizzy, or have vision changes or ringing in the ears.    ??? Some people get severe pain in the shoulder and have difficulty moving the arm where a shot was given. This happens very rarely.    ??? Any medication can cause a severe allergic reaction. Such reactions from a vaccine are very rare, estimated at about 1 in a million doses, and would happen within a few minutes to a few hours after the vaccination.    As with any medicine, there is a very remote chance of a vaccine causing a serious injury or death.    The safety of vaccines is always being monitored. For more information, visit: www.cdc.gov/vaccinesafety/    5. What if there is a serious reaction?    What should I look for?    ??? Look for anything that concerns you, such as signs of a severe allergic reaction, very high fever, or unusual behavior.    Signs of a severe allergic reaction can include hives, swelling of the  face and throat, difficulty breathing, a fast heartbeat, dizziness, and weakness ??? usually within a few minutes to a few hours after the vaccination.    What should I do?    ??? If you think it is a severe allergic reaction or other emergency that   can???t wait, call 9-1-1 and get the person to the nearest hospital. Otherwise, call your doctor.    ??? Reactions should be reported to the Vaccine Adverse Event Reporting System (VAERS). Your doctor should file this report, or you can do it yourself through  the VAERS web site at www.vaers.hhs.gov, or by calling 1-800-822-7967.    VAERS does not give medical advice.    6. The National Vaccine Injury Compensation Program    The National Vaccine Injury Compensation Program (VICP) is a federal program that was created to compensate people who may have been injured by certain vaccines.    Persons who believe they may have been injured by a vaccine can learn about the program and about filing a claim by calling 1-800-338-2382 or visiting the VICP website at www.hrsa.gov/vaccinecompensation.  There is a time limit to file a claim for compensation.    7. How can I learn more?  ??? Ask your healthcare provider. He or she can give you the vaccine package insert or suggest other sources of information.  ??? Call your local or state health department.  ??? Contact the Centers for Disease Control and Prevention (CDC):  - Call 1-800-232-4636 (1-800-CDC-INFO) or  - Visit CDC???s website at www.cdc.gov/flu    Vaccine Information Statement   Inactivated Influenza Vaccine   08/02/2014  42 U.S.C. ?? [id number]    Department of Health and Human Services  Centers for Disease Control and Prevention    Office Use Only

## 2015-10-29 ENCOUNTER — Inpatient Hospital Stay: Admit: 2015-10-29 | Payer: BLUE CROSS/BLUE SHIELD | Primary: Family Medicine

## 2015-10-29 ENCOUNTER — Institutional Professional Consult (permissible substitution)
Admit: 2015-10-29 | Discharge: 2015-10-29 | Payer: PRIVATE HEALTH INSURANCE | Attending: Family Medicine | Primary: Family Medicine

## 2015-10-29 DIAGNOSIS — Z0184 Encounter for antibody response examination: Secondary | ICD-10-CM

## 2015-10-29 NOTE — Patient Instructions (Signed)
Please contact our office if you have any questions about your visit today.

## 2015-10-29 NOTE — Progress Notes (Signed)
Tracy [name] 24 y.o. female   Chief Complaint   Patient presents with   ??? Immunization/Injection     [facility]   ??? Labs Only     IMMUNITY STATUS TESTING          1. Have you been to the ER, urgent care clinic since your last visit?  Hospitalized since your last visit?No    2. Have you seen or consulted any other health care providers outside of the [facility] since your last visit?  Include any pap smears or colon screening. No

## 2015-10-30 LAB — HEPATITIS B SURFACE ANTIBODY
Hep B S Ab Interp: NEGATIVE — AB
Hep B S Ab: <3.1 m[IU]/mL — ABNORMAL LOW (ref >10.0)

## 2015-10-30 LAB — HEP B SURFACE AB
Hep B surface Ab Interp.: NEGATIVE — AB
Hepatitis B surface Ab: <3.1 m[IU]/mL — ABNORMAL LOW (ref >10.0)

## 2015-10-30 LAB — RUBELLA AB, IGG: Rubella IgG, QL: IMMUNE

## 2015-10-30 LAB — RUBEOLA AB, IGG: Rubeola Ab, IgG: <25 AU/mL — ABNORMAL LOW (ref >29.9)

## 2015-10-30 LAB — VZV AB, IGG: V. zoster, IgG: 2953 index (ref >165)

## 2015-11-03 ENCOUNTER — Institutional Professional Consult (permissible substitution): Admit: 2015-11-03 | Payer: PRIVATE HEALTH INSURANCE | Attending: Internal Medicine | Primary: Family Medicine

## 2015-11-03 DIAGNOSIS — Z23 Encounter for immunization: Secondary | ICD-10-CM

## 2015-11-03 NOTE — Progress Notes (Signed)
Tracy [name] 24 y.o. female   Chief Complaint   Patient presents with   ??? Immunization/Injection     repeat HEP B series, and MMR due to no immunity         1. Have you been to the ER, urgent care clinic since your last visit?  Hospitalized since your last visit?No    2. Have you seen or consulted any other health care providers outside of the [facility] since your last visit?  Include any pap smears or colon screening. No     Tracy [name] [birth year] . female who presents for routine immunizations.   She denies any symptoms , reactions or allergies that would exclude them from being immunized today.  Risks and adverse reactions were discussed and the VIS was given to them. All questions were addressed.  Order placed for HEP B #1, AND MMR #1,  per Verbal Order from DR.GRAVES with read back.  Patient was observed for 15 min post injection. There were no reactions observed.    Crist Infante, LPN

## 2015-11-03 NOTE — Patient Instructions (Addendum)
Please contact our office if you have any questions about your visit today.    Vaccine Information Statement     Hepatitis B Vaccine: What You Need to Know    Many Vaccine Information Statements are available in Spanish and other languages. See [email or web address].[email or web address].  Hojas de informaci??n sobre vacunas est??n disponibles en espa??ol y en muchos otros idiomas. Visite [email or web address]    1. Why get vaccinated?    Hepatitis B is a serious disease that affects the liver.  It is caused by the hepatitis B virus.  Hepatitis B can cause mild illness lasting a few weeks, or it can lead to a serious, lifelong illness.     Hepatitis B virus infection can be either acute or chronic.    Acute hepatitis B virus infection is a short-term illness that occurs within the first 6 months after someone is exposed to the hepatitis B virus. This can lead to:  ??? fever, fatigue, loss of appetite, nausea, and/or vomiting  ??? jaundice (yellow skin or eyes, dark urine, clay-colored bowel movements)  ??? pain in muscles, joints, and stomach    Chronic hepatitis B virus infection is a long-term illness that occurs when the hepatitis B virus remains in a person???s body.  Most people who go on to develop chronic hepatitis B do not have symptoms, but it is still very serious and can lead to:  ??? liver damage (cirrhosis)  ??? liver cancer  ??? death    Chronically-infected people can spread hepatitis B virus to others, even if they do not feel or look sick themselves. Up to 1.4 million people in the [place] may have chronic hepatitis B infection.  About 90% of infants who get hepatitis B become chronically infected and about 1 out of 4 of them dies.    Hepatitis B is spread when blood, semen, or other body fluid infected with the Hepatitis B virus enters the body of a person who is not infected. People can become infected with the virus through:  ??? Birth (a baby whose mother is infected can be infected at or after birth)   ??? Sharing items such as razors or toothbrushes with an infected person  ??? Contact with the blood or open sores of an infected person  ??? Sex with an infected partner  ??? Sharing needles, syringes, or other drug-injection equipment  ??? Exposure to blood from needlesticks or other sharp instruments    Each year about 2,000 people in the [place] die from hepatitis B-related liver disease.     Hepatitis B vaccine can prevent hepatitis B and its consequences, including liver cancer and cirrhosis.    2. Hepatitis B vaccine    Hepatitis B vaccine is made from parts of the hepatitis B virus. It cannot cause hepatitis B infection. The vaccine is usually given as 3 or 4 shots over a 2-monthperiod.    Infants should get their first dose of hepatitis B vaccine at birth and will usually complete the series at 677months of age.    All children and adolescents younger than 157years of age who have not yet gotten the vaccine should also be vaccinated.     Hepatitis B vaccine is recommended for unvaccinated adults who are at risk for hepatitis B virus infection, including:  ??? People whose sex partners have hepatitis B  ??? Sexually active persons who are not in a long-term monogamous relationship  ??? Persons seeking evaluation or treatment for a sexually transmitted  disease  ??? Men who have sexual contact with other men  ??? People who share needles, syringes, or other drug-injection equipment  ??? People who have household contact with someone infected with the hepatitis B virus  ??? Health care and public safety workers at risk for exposure to blood or body fluids   ??? Residents and staff of facilities for developmentally disabled persons  ??? Persons in correctional facilities  ??? Victims of sexual assault or abuse  ??? Travelers to regions with increased rates of hepatitis B  ??? People with chronic liver disease, kidney disease, HIV infection, or diabetes  ??? Anyone who wants to be protected from hepatitis B      There are no known risks to getting hepatitis B vaccine at the same time as other vaccines.    3. Some people should not get this vaccine.    Tell the person who is giving the vaccine:    ??? If the person getting the vaccine has any severe, life-threatening allergies.    If you ever had a life-threatening allergic reaction after a dose of hepatitis B vaccine, or have a severe allergy to any part of this vaccine, you may be advised not to get vaccinated.  Ask your health care provider if you want information about vaccine components.        ??? If the person getting the vaccine is not feeling well.    If you have a mild illness, such as a cold, you can probably get the vaccine today. If you are moderately or severely ill, you should probably wait until you recover. Your doctor can advise you.    4. Risks of a vaccine reaction    With any medicine, including vaccines, there is a chance of side effects. These are usually mild and go away on their own, but serious reactions are also possible.     Most people who get hepatitis B vaccine do not have any problems with it.     Minor problems following hepatitis B vaccine include:   ??? soreness where the shot was given  ??? temperature of 99.9??F or higher  If these problems occur, they usually begin soon after the shot and last 1 or 2 days.     Your doctor can tell you more about these reactions.    Other problems that could happen after this vaccine:    ??? People sometimes faint after a medical procedure, including vaccination. Sitting or lying down for about 15 minutes can help prevent fainting and injuries caused by a fall. Tell your provider if you feel dizzy, or have vision changes or ringing in the ears.    ??? Some people get shoulder pain that can be more severe and longer-lasting than the more routine soreness that can follow injections. This happens very rarely.    ??? Any medication can cause a severe allergic reaction. Such reactions from  a vaccine are very rare, estimated at about 1 in a million doses, and would happen within a few minutes to a few hours after the vaccination.    As with any medicine, there is a very remote chance of a vaccine causing a serious injury or death.    The safety of vaccines is always being monitored. For more information, visit: http://www.aguilar.org/    5. What if there is a serious problem?    What should I look for?    ??? Look for anything that concerns you, such as signs of a severe  allergic reaction, very high fever, or unusual behavior.    Signs of a severe allergic reaction can include hives, swelling of the face and throat, difficulty breathing, a fast heartbeat, dizziness, and weakness. These would usually start a few minutes to a few hours after the vaccination.    What should I do?    ??? If you think it is a severe allergic reaction or other emergency that can???t wait, call 9-1-1 and get to the nearest hospital. Otherwise, call your clinic.    Afterward, the reaction should be reported to the Vaccine Adverse Event Reporting System (VAERS). Your doctor should file this report, or you can do it yourself through the VAERS web site at www.vaers.SamedayNews.es, or by calling 702-754-1169.    VAERS does not give medical advice.    6. The National Vaccine Injury Compensation Program    The [employer] Vaccine Injury Compensation Program (VICP) is a federal program that was created to compensate people who may have been injured by certain vaccines.    Persons who believe they may have been injured by a vaccine can learn about the program and about filing a claim by calling 912-108-2753 or visiting the [place] website at [email or web address].  There is a time limit to file a claim for compensation.    7. How can I learn more?    ??? Ask your healthcare provider. He or she can give you the vaccine package insert or suggest other sources of information.  ??? Call your local or state health department.   ??? Contact the Centers for Disease Control and Prevention (CDC):  - Call (386)156-2443 (1-800-CDC-INFO) or  - Visit CDC???s website at http://hunter.com/    Vaccine Information Statement   Hepatitis B Vaccine  07/16/2015  42 U.S.C. ?? 295MW-[id number]    U.S. Department of Health and [employer] for Disease Control and Prevention    Office Use Only  MMR (Measles, Mumps and Rubella) Vaccine: What You Need to Know    Many Vaccine Information Statements are available in Spanish and other languages. See AbsolutelyGenuine.com.br  Hojas de Informaci??n Sobre Vacunas est??n disponibles en espa??ol y en muchos otros idiomas. Visite AbsolutelyGenuine.com.br    1. Why get vaccinated?    Measles, mumps, and rubella are serious diseases.  Before vaccines they were very common, especially among children.    Measles  ??? Measles virus causes rash, cough, runny nose, eye irritation, and fever.  ??? It can lead to ear infection, pneumonia, seizures (jerking and staring), brain damage, and death.    Mumps  ??? Mumps virus causes fever, headache, muscle pain, loss of appetite, and swollen glands.  ??? It can lead to deafness, meningitis (infection of the brain and spinal cord covering), painful swelling of the testicles or ovaries, and rarely sterility.    Rubella (German Measles)  ??? Rubella virus causes rash, arthritis (mostly in women), and mild fever.  ??? If a woman gets rubella while she is pregnant, she could have a miscarriage or her baby could be born with serious birth defects.    These diseases spread from person to person through the air. You can easily catch them by being around someone who is already infected.    Measles, mumps, and rubella (MMR) vaccine can protect children (and adults) from all three of these diseases.     Thanks to successful vaccination programs these diseases are much less common in the [place] than they used to be.  But if we stopped vaccinating they would  return.       2. Who should get MMR vaccine and when?     Children should get 2 doses of MMR vaccine:    ??? First Dose: 72-95 months of age    ??? Second Dose: 33-37 years of age (may be given earlier, if at least 28 days after the 1st dose)    Some infants younger than 12 months should get a dose of MMR if they are traveling out of the country. (This dose will not count toward their routine series.)     Some adults should also get MMR vaccine: Generally, anyone 71 years of age or older who was born after [birth year] should get at least one dose of MMR vaccine, unless they can show that they have either been vaccinated or had all three diseases.      MMR vaccine may be given at the same time as other vaccines.    Children between 76 and 95 years of age can get a ???combination??? vaccine called MMRV, which contains both MMR and varicella (chickenpox) vaccines.  There is a separate Vaccine Information Statement for MMRV.       3. Some people should not get MMR vaccine or should wait.    ??? Anyone who has ever had a life-threatening allergic reaction to the antibiotic neomycin, or any other component of MMR vaccine, should not get the vaccine. Tell your doctor if you have any severe allergies.     ??? Anyone who had a life-threatening allergic reaction to a previous dose of MMR or MMRV vaccine should not get another dose.    ??? Some people who are sick at the time the shot is scheduled may be advised to wait until they recover before getting MMR vaccine.    ??? Pregnant women should not get MMR vaccine. Pregnant women who need the vaccine should wait until after giving birth. Women should avoid getting pregnant for 4 weeks after vaccination with MMR vaccine.    ??? Tell your doctor if the person getting the vaccine:   - Has HIV/AIDS, or another disease that affects the immune system   - Is being treated with drugs that affect the immune system, such as steroids    - Has any kind of cancer   - Is being treated for cancer with radiation or drugs    - Has ever had a low platelet count (a blood disorder)   - Has gotten another vaccine within the past 4 weeks   - Has recently had a transfusion or received other blood products   Any of these might be a reason to not get the vaccine, or delay, vaccination until later.      4. What are the risks from MMR vaccine?    A vaccine, like any medicine, is capable of causing serious problems, such as severe allergic reactions. The risk of MMR vaccine causing serious harm, or death, is extremely small.    Getting MMR vaccine is much safer than getting measles, mumps or rubella.    Most people who get MMR vaccine do not have any serious problems with it.    Mild Problems  ??? Fever (up to 1 person out of 6)  ??? Mild rash (about 1 person out of 20)  ??? Swelling of glands in the cheeks or neck (about 1 person out of 75)    If these problems occur, it is usually within 6-14 days after the shot. They occur less often after the second  dose.    Moderate Problems  ??? Seizure (jerking or staring) caused by fever (about 1 out of 3,000 doses)  ??? Temporary pain and stiffness in the joints, mostly in teenage or adult women (up to 1 out of 4)  ??? Temporary low platelet count, which can cause a bleeding disorder (about 1 out of 30,000 doses)    Severe Problems (Very Rare)   ??? Serious allergic reaction (less than 1 out of a million doses)  ??? Several other severe problems have been reported after a child gets MMR vaccine, including:   - Deafness    - Long-term seizures, coma, or lowered consciousness   - Permanent brain damage.   These are so rare that it is hard to tell whether they are caused by the vaccine.      5. What if there is a serious  reaction?    What should I look for?  Any unusual condition, such as a high fever or unusual behavior. Signs of a serious allergic reaction can include difficulty breathing, hoarseness or wheezing, hives, paleness, weakness, a fast heart beat or dizziness.    What should I do?    ??? Call a doctor, or get the person to a doctor right away.   ??? Tell your doctor what happened, the date and time it happened, and when the vaccination was given.   ??? Ask your doctor to report the reaction by filing a Vaccine Adverse Event Reporting System  (VAERS) form. Or you can file this report through the VAERS website at www.vaers.SamedayNews.es, or by calling 703-356-1728.     VAERS does not provide medical advice.              6. The National Vaccine Injury Compensation Program (VICP)    The National Vaccine Injury Compensation Program ([place]) was created in 1986.      Persons who believe they may have been injured by a vaccine can learn about the program and about filing a claim with VICP by calling [phone] or visiting their website at [email or web address].      7. How can I learn more?    ??? Ask your doctor.   ??? Call your local or state health department.  ??? Contact the Centers for Disease Control and Prevention (CDC):   - Call 703-770-2822 (1-800-CDC-INFO)   - Visit CDC???s website at http://hunter.com/      Vaccine Information Statement (Interim)  MMR Vaccine  (04/16/2011)   42 U.S.C. ?? [id number]    U.S. Department of Health and G[employer]er for Disease Control and Prevention    Office Use Only

## 2015-11-03 NOTE — Telephone Encounter (Signed)
Pt said you told her to call to see if we had the vaccines she needs in stock before she drives out here.

## 2015-11-03 NOTE — Telephone Encounter (Signed)
Pt was given info requested.

## 2015-12-05 ENCOUNTER — Institutional Professional Consult (permissible substitution): Admit: 2015-12-05 | Payer: PRIVATE HEALTH INSURANCE | Attending: Internal Medicine | Primary: Family Medicine

## 2015-12-05 DIAGNOSIS — Z23 Encounter for immunization: Secondary | ICD-10-CM

## 2015-12-05 NOTE — Progress Notes (Signed)
Tracy [name] 24 y.o. female   Chief Complaint   Patient presents with   ??? Immunization/Injection     HEP B 2ND   Tracy [name] April 24, 1991 . female who presents for routine immunizations.   She denies any symptoms , reactions or allergies that would exclude them from being immunized today.  Risks and adverse reactions were discussed and the VIS was given to them. All questions were addressed.  Order placed for HEP B 2ND,  per Verbal Order from DR.OBENG,DO with read back.  Patient was observed for 15 min post injection. There were no reactions observed.    Tracy CharyJessica A Chenel Wernli, LPN

## 2015-12-05 NOTE — Patient Instructions (Addendum)
Vaccine Information Statement     Hepatitis B Vaccine: What You Need to Know    Many Vaccine Information Statements are available in Spanish and other languages. See www.immunize.org/vis.  Hojas de informaci??n sobre vacunas est??n disponibles en espa??ol y en muchos otros idiomas. Visite www.immunize.org/vis    1. Why get vaccinated?    Hepatitis B is a serious disease that affects the liver.  It is caused by the hepatitis B virus.  Hepatitis B can cause mild illness lasting a few weeks, or it can lead to a serious, lifelong illness.     Hepatitis B virus infection can be either acute or chronic.    Acute hepatitis B virus infection is a short-term illness that occurs within the first 6 months after someone is exposed to the hepatitis B virus. This can lead to:  ??? fever, fatigue, loss of appetite, nausea, and/or vomiting  ??? jaundice (yellow skin or eyes, dark urine, clay-colored bowel movements)  ??? pain in muscles, joints, and stomach    Chronic hepatitis B virus infection is a long-term illness that occurs when the hepatitis B virus remains in a person???s body.  Most people who go on to develop chronic hepatitis B do not have symptoms, but it is still very serious and can lead to:  ??? liver damage (cirrhosis)  ??? liver cancer  ??? death    Chronically-infected people can spread hepatitis B virus to others, even if they do not feel or look sick themselves. Up to 1.4 million people in the [place] may have chronic hepatitis B infection.  About 90% of infants who get hepatitis B become chronically infected and about 1 out of 4 of them dies.    Hepatitis B is spread when blood, semen, or other body fluid infected with the Hepatitis B virus enters the body of a person who is not infected. People can become infected with the virus through:  ??? Birth (a baby whose mother is infected can be infected at or after birth)  ??? Sharing items such as razors or toothbrushes with an infected person   ??? Contact with the blood or open sores of an infected person  ??? Sex with an infected partner  ??? Sharing needles, syringes, or other drug-injection equipment  ??? Exposure to blood from needlesticks or other sharp instruments    Each year about 2,000 people in the [place] die from hepatitis B-related liver disease.     Hepatitis B vaccine can prevent hepatitis B and its consequences, including liver cancer and cirrhosis.    2. Hepatitis B vaccine    Hepatitis B vaccine is made from parts of the hepatitis B virus. It cannot cause hepatitis B infection. The vaccine is usually given as 3 or 4 shots over a 6-month period.    Infants should get their first dose of hepatitis B vaccine at birth and will usually complete the series at 6 months of age.    All children and adolescents younger than 19 years of age who have not yet gotten the vaccine should also be vaccinated.     Hepatitis B vaccine is recommended for unvaccinated adults who are at risk for hepatitis B virus infection, including:  ??? People whose sex partners have hepatitis B  ??? Sexually active persons who are not in a long-term monogamous relationship  ??? Persons seeking evaluation or treatment for a sexually transmitted disease  ??? Men who have sexual contact with other men  ??? People who share   needles, syringes, or other drug-injection equipment  ??? People who have household contact with someone infected with the hepatitis B virus  ??? Health care and public safety workers at risk for exposure to blood or body fluids   ??? Residents and staff of facilities for developmentally disabled persons  ??? Persons in correctional facilities  ??? Victims of sexual assault or abuse  ??? Travelers to regions with increased rates of hepatitis B  ??? People with chronic liver disease, kidney disease, HIV infection, or diabetes  ??? Anyone who wants to be protected from hepatitis B     There are no known risks to getting hepatitis B vaccine at the same time as other vaccines.     3. Some people should not get this vaccine.    Tell the person who is giving the vaccine:    ??? If the person getting the vaccine has any severe, life-threatening allergies.    If you ever had a life-threatening allergic reaction after a dose of hepatitis B vaccine, or have a severe allergy to any part of this vaccine, you may be advised not to get vaccinated.  Ask your health care provider if you want information about vaccine components.        ??? If the person getting the vaccine is not feeling well.    If you have a mild illness, such as a cold, you can probably get the vaccine today. If you are moderately or severely ill, you should probably wait until you recover. Your doctor can advise you.    4. Risks of a vaccine reaction    With any medicine, including vaccines, there is a chance of side effects. These are usually mild and go away on their own, but serious reactions are also possible.     Most people who get hepatitis B vaccine do not have any problems with it.     Minor problems following hepatitis B vaccine include:   ??? soreness where the shot was given  ??? temperature of 99.9??F or higher  If these problems occur, they usually begin soon after the shot and last 1 or 2 days.     Your doctor can tell you more about these reactions.    Other problems that could happen after this vaccine:    ??? People sometimes faint after a medical procedure, including vaccination. Sitting or lying down for about 15 minutes can help prevent fainting and injuries caused by a fall. Tell your provider if you feel dizzy, or have vision changes or ringing in the ears.    ??? Some people get shoulder pain that can be more severe and longer-lasting than the more routine soreness that can follow injections. This happens very rarely.    ??? Any medication can cause a severe allergic reaction. Such reactions from a vaccine are very rare, estimated at about 1 in a million doses, and  would happen within a few minutes to a few hours after the vaccination.    As with any medicine, there is a very remote chance of a vaccine causing a serious injury or death.    The safety of vaccines is always being monitored. For more information, visit: www.cdc.gov/vaccinesafety/    5. What if there is a serious problem?    What should I look for?    ??? Look for anything that concerns you, such as signs of a severe allergic reaction, very high fever, or unusual behavior.    Signs of a severe allergic   reaction can include hives, swelling of the face and throat, difficulty breathing, a fast heartbeat, dizziness, and weakness. These would usually start a few minutes to a few hours after the vaccination.    What should I do?    ??? If you think it is a severe allergic reaction or other emergency that can???t wait, call 9-1-1 and get to the nearest hospital. Otherwise, call your clinic.    Afterward, the reaction should be reported to the Vaccine Adverse Event Reporting System (VAERS). Your doctor should file this report, or you can do it yourself through the VAERS web site at www.vaers.hhs.gov, or by calling 1-800-822-7967.    VAERS does not give medical advice.    6. The National Vaccine Injury Compensation Program    The National Vaccine Injury Compensation Program (VICP) is a federal program that was created to compensate people who may have been injured by certain vaccines.    Persons who believe they may have been injured by a vaccine can learn about the program and about filing a claim by calling 1-800-338-2382 or visiting the VICP website at www.hrsa.gov/vaccinecompensation.  There is a time limit to file a claim for compensation.    7. How can I learn more?    ??? Ask your healthcare provider. He or she can give you the vaccine package insert or suggest other sources of information.  ??? Call your local or state health department.  ??? Contact the Centers for Disease Control and Prevention (CDC):   - Call 1-800-232-4636 (1-800-CDC-INFO) or  - Visit CDC???s website at www.cdc.gov/vaccines    Vaccine Information Statement   Hepatitis B Vaccine  07/16/2015  42 U[place] ?? [id number]    U.S. Department of Health and Human Services  Centers for Disease Control and Prevention    Office Use Only

## 2015-12-10 NOTE — Telephone Encounter (Signed)
Tracy [name] pls call concerning hep b injection

## 2015-12-11 NOTE — Telephone Encounter (Signed)
Pt's immunization was updated with correct dosage of HEP B vaccination.

## 2016-05-04 ENCOUNTER — Institutional Professional Consult (permissible substitution)
Admit: 2016-05-04 | Discharge: 2016-05-04 | Payer: PRIVATE HEALTH INSURANCE | Attending: Family Medicine | Primary: Family Medicine

## 2016-05-04 DIAGNOSIS — Z23 Encounter for immunization: Secondary | ICD-10-CM

## 2016-05-04 NOTE — Patient Instructions (Signed)
Please contact our office if you have any questions about your visit today.

## 2016-05-04 NOTE — Progress Notes (Signed)
Chief Complaint   Patient presents with   ??? Immunization/Injection     here for 3rd Hep B shot     1. Have you been to the ER, urgent care clinic since your last visit?  Hospitalized since your last visit?No    2. Have you seen or consulted any other health care providers outside of the [facility] since your last visit?  Include any pap smears or colon screening. No    Katianne A Mccarroll 1991-12-18 . female who presents for routine immunizations.   She denies any symptoms , reactions or allergies that would exclude them from being immunized today.  Risks and adverse reactions were discussed and the VIS was given to them. All questions were addressed.  Order placed for Hepatitis B vaccine,  per Verbal Order from Dr Luiz BlareGraves with read back.  Patient was observed for 20 min post injection. There were no reactions observed.    Ronni RumbleAlicia S. Christin FudgeSteele, LPN

## 2018-03-15 ENCOUNTER — Emergency Department (HOSPITAL_COMMUNITY)
Admission: EM | Admit: 2018-03-15 | Discharge: 2018-03-15 | Disposition: A | Discharge status: Home / Self Care | Payer: No Typology Code available for payment source | Attending: Emergency Medicine | Admitting: Emergency Medicine

## 2018-03-15 ENCOUNTER — Encounter (HOSPITAL_COMMUNITY): Payer: Self-pay

## 2018-03-15 ENCOUNTER — Emergency Department (HOSPITAL_COMMUNITY): Payer: No Typology Code available for payment source

## 2018-03-15 ENCOUNTER — Other Ambulatory Visit: Payer: Self-pay

## 2018-03-15 DIAGNOSIS — M545 Low back pain: Secondary | ICD-10-CM | POA: Diagnosis not present

## 2018-03-15 DIAGNOSIS — S161XXA Strain of muscle, fascia and tendon at neck level, initial encounter: Secondary | ICD-10-CM | POA: Insufficient documentation

## 2018-03-15 DIAGNOSIS — Y939 Activity, unspecified: Secondary | ICD-10-CM | POA: Insufficient documentation

## 2018-03-15 DIAGNOSIS — Y92410 Unspecified street and highway as the place of occurrence of the external cause: Secondary | ICD-10-CM | POA: Diagnosis not present

## 2018-03-15 DIAGNOSIS — Y999 Unspecified external cause status: Secondary | ICD-10-CM | POA: Diagnosis not present

## 2018-03-15 DIAGNOSIS — S199XXA Unspecified injury of neck, initial encounter: Secondary | ICD-10-CM | POA: Diagnosis present

## 2018-03-15 MED ORDER — CYCLOBENZAPRINE HCL 10 MG PO TABS: 10.0000 mg | ORAL_TABLET | Freq: Two times a day (BID) | ORAL | 0 refills | Status: AC | PRN

## 2018-03-15 MED ORDER — NAPROXEN 500 MG PO TABS: 500.0000 mg | ORAL_TABLET | Freq: Two times a day (BID) | ORAL | 0 refills | Status: AC

## 2018-03-15 NOTE — Discharge Instructions (Signed)
Please read attached information regarding your condition. Apply heating pad and stretch areas as tolerated. Return to ED for worsening symptoms, additional injuries or falls, loss of bowel or bladder function, numbness in legs, lightheadedness or loss of consciousness.
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
## 2019-04-16 ENCOUNTER — Telehealth: Payer: No Typology Code available for payment source | Admitting: Family

## 2019-04-16 DIAGNOSIS — R399 Unspecified symptoms and signs involving the genitourinary system: Secondary | ICD-10-CM

## 2019-04-16 MED ORDER — CEPHALEXIN 500 MG PO CAPS: 500.0000 mg | ORAL_CAPSULE | Freq: Two times a day (BID) | ORAL | 0 refills | Status: AC

## 2019-04-16 NOTE — Progress Notes (Signed)

## 2019-10-17 DIAGNOSIS — Z01419 Encounter for gynecological examination (general) (routine) without abnormal findings: Secondary | ICD-10-CM | POA: Diagnosis not present

## 2020-10-30 ENCOUNTER — Encounter: Attending: Family Medicine | Primary: Family Medicine

## 2020-11-18 ENCOUNTER — Encounter: Attending: Family Medicine | Primary: Family Medicine

## 2020-12-18 ENCOUNTER — Encounter: Attending: Family Medicine | Primary: Family Medicine

## 2023-05-17 ENCOUNTER — Encounter: Attending: Family Medicine | Primary: Family Medicine
